# Patient Record
Sex: Male | Born: 1970 | Race: Black or African American | Hispanic: No | Marital: Married | Smoking: Current every day smoker
Health system: Southern US, Community
[De-identification: ages and names within clinical notes are randomized; demographics above are authoritative.]

---

## 2005-03-09 ENCOUNTER — Emergency Department: Payer: Self-pay | Admitting: Emergency Medicine

## 2005-03-09 ENCOUNTER — Other Ambulatory Visit: Payer: Self-pay

## 2006-01-04 IMAGING — CR DG CHEST 2V
1 series · 3 of 3 positions shown · non-contrast
Comparison: none

REASON FOR EXAM: Pain
COMMENTS:

[Series 1: view not recorded · 0.17mm/px · 3 of 3 slices shown]
[im 1/3]
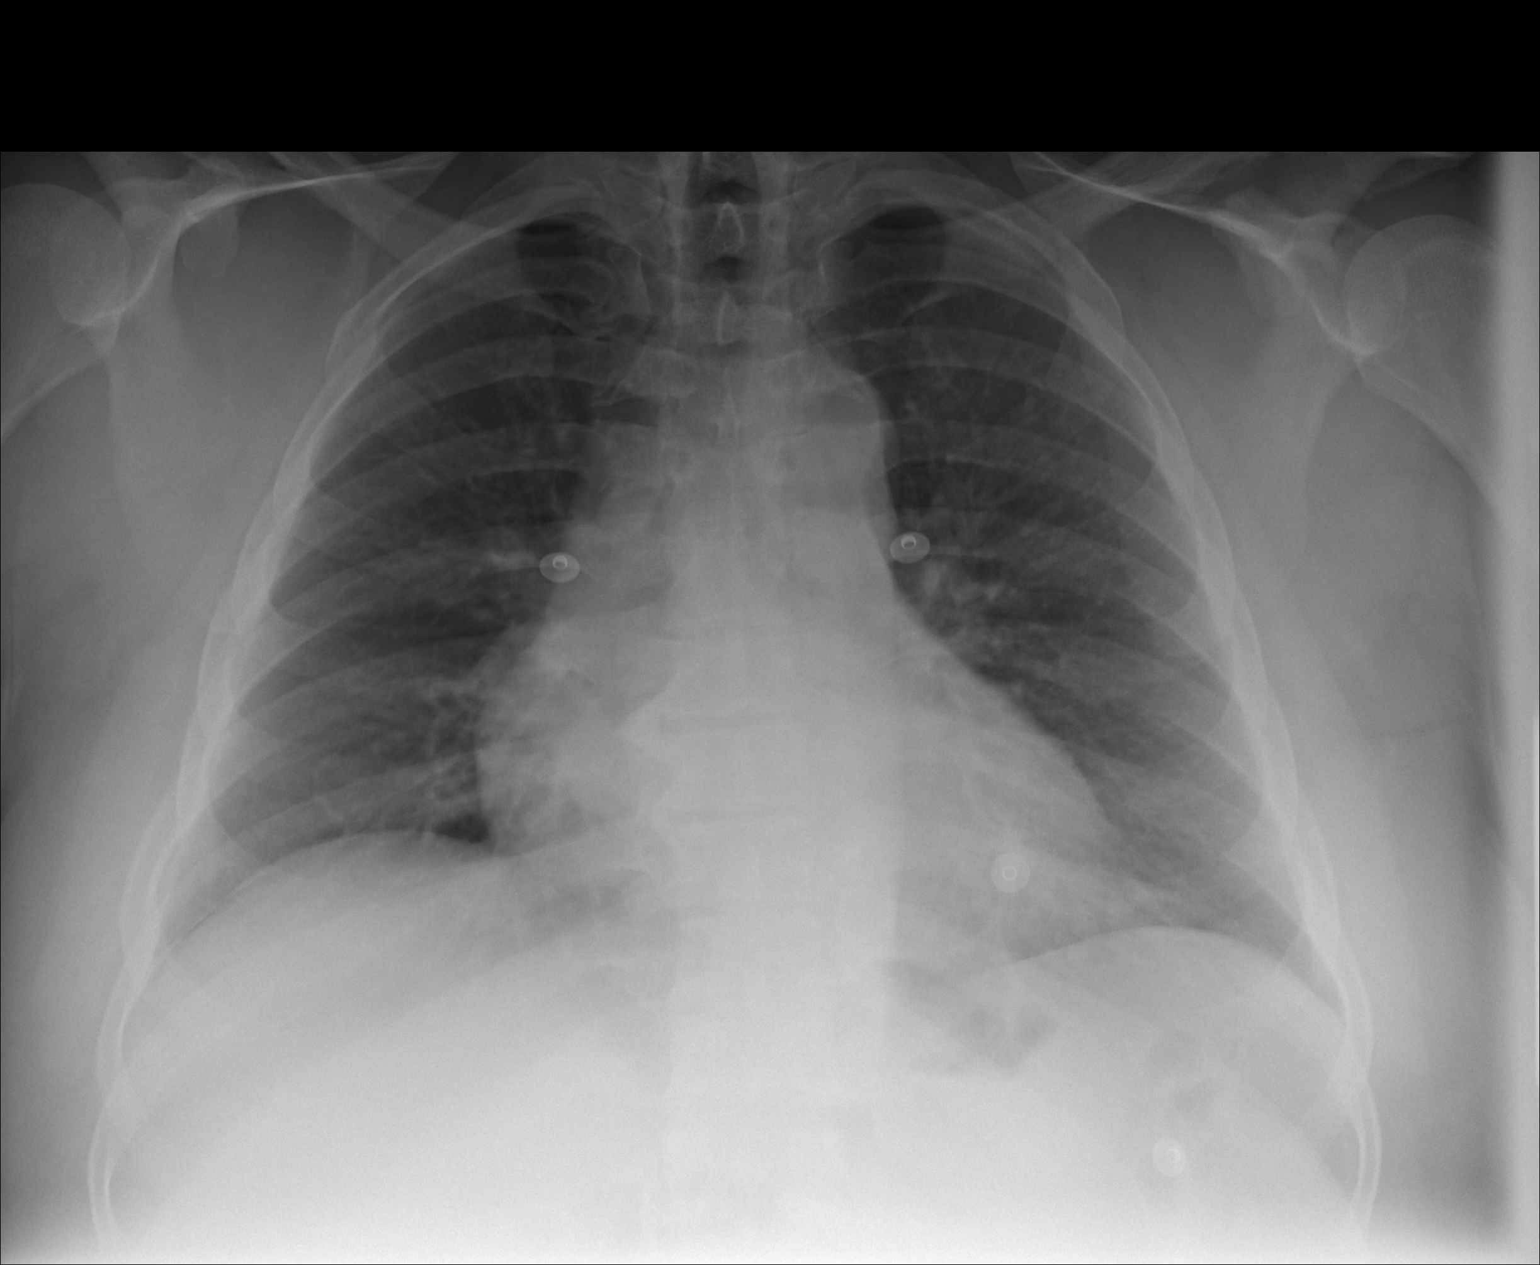
[im 2/3]
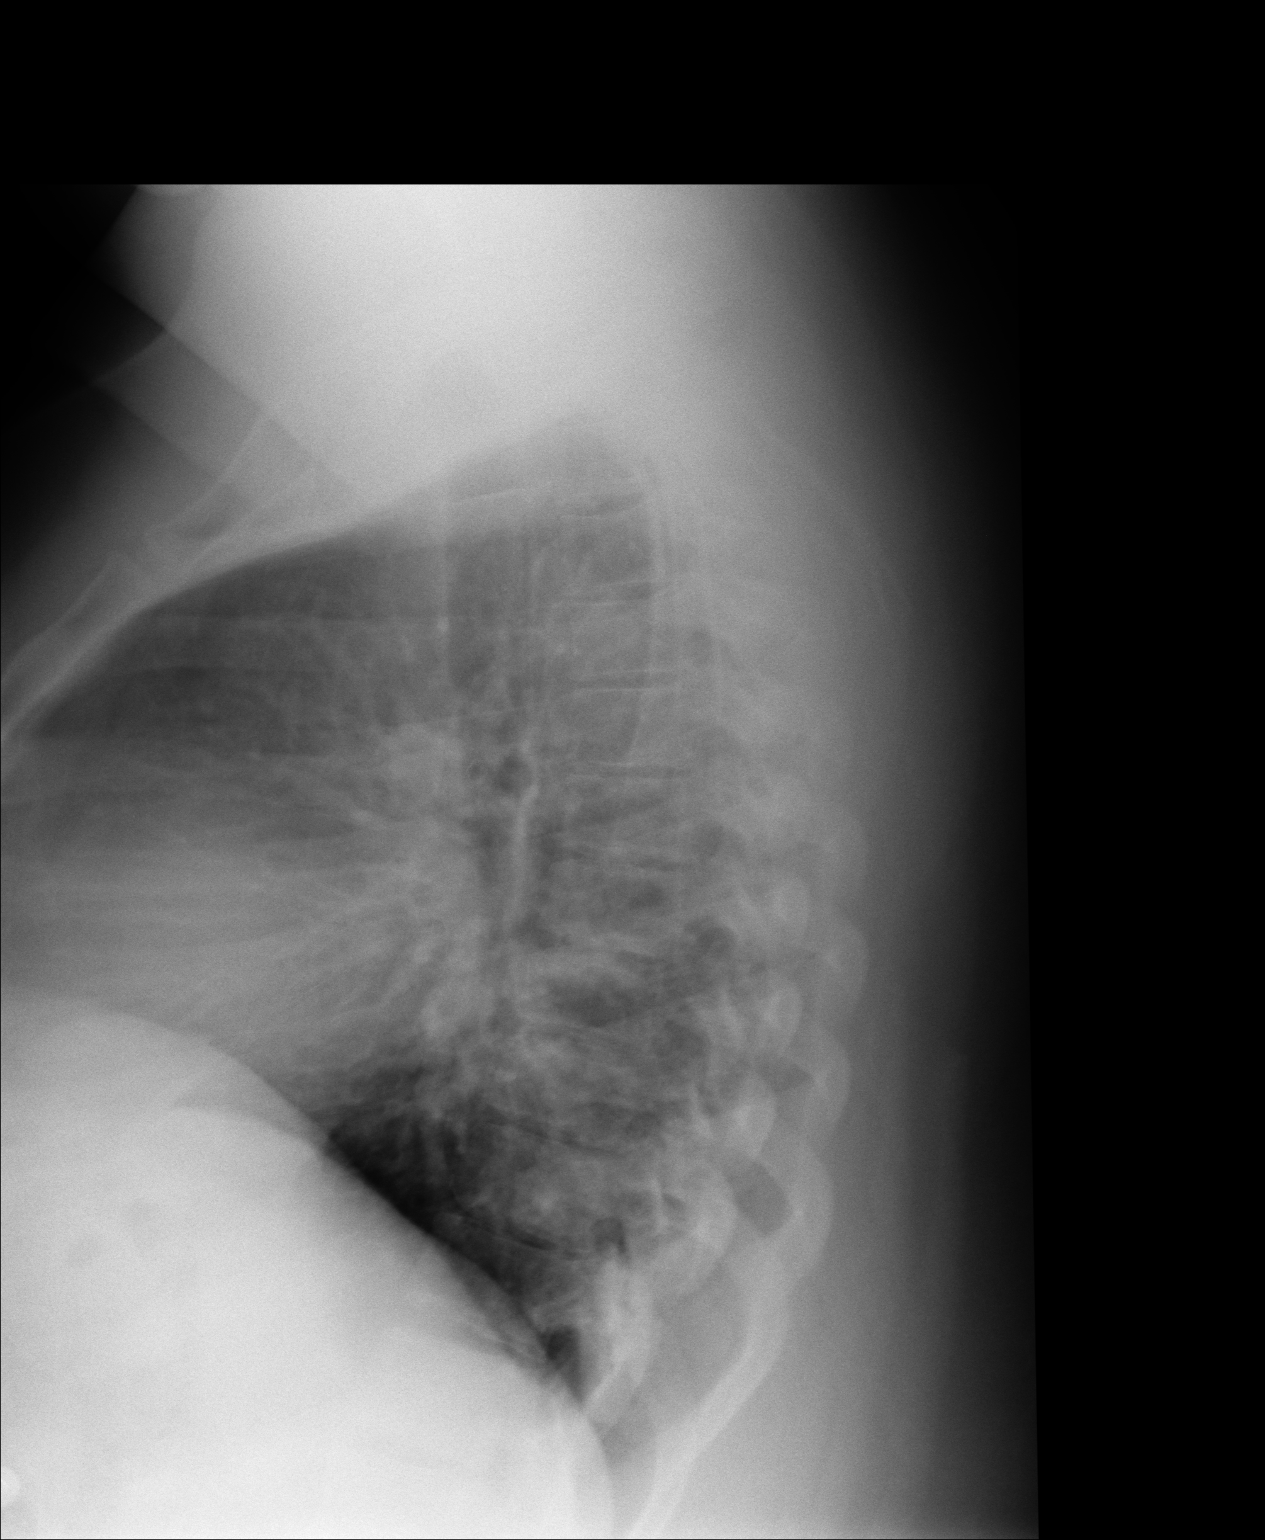
[im 3/3]
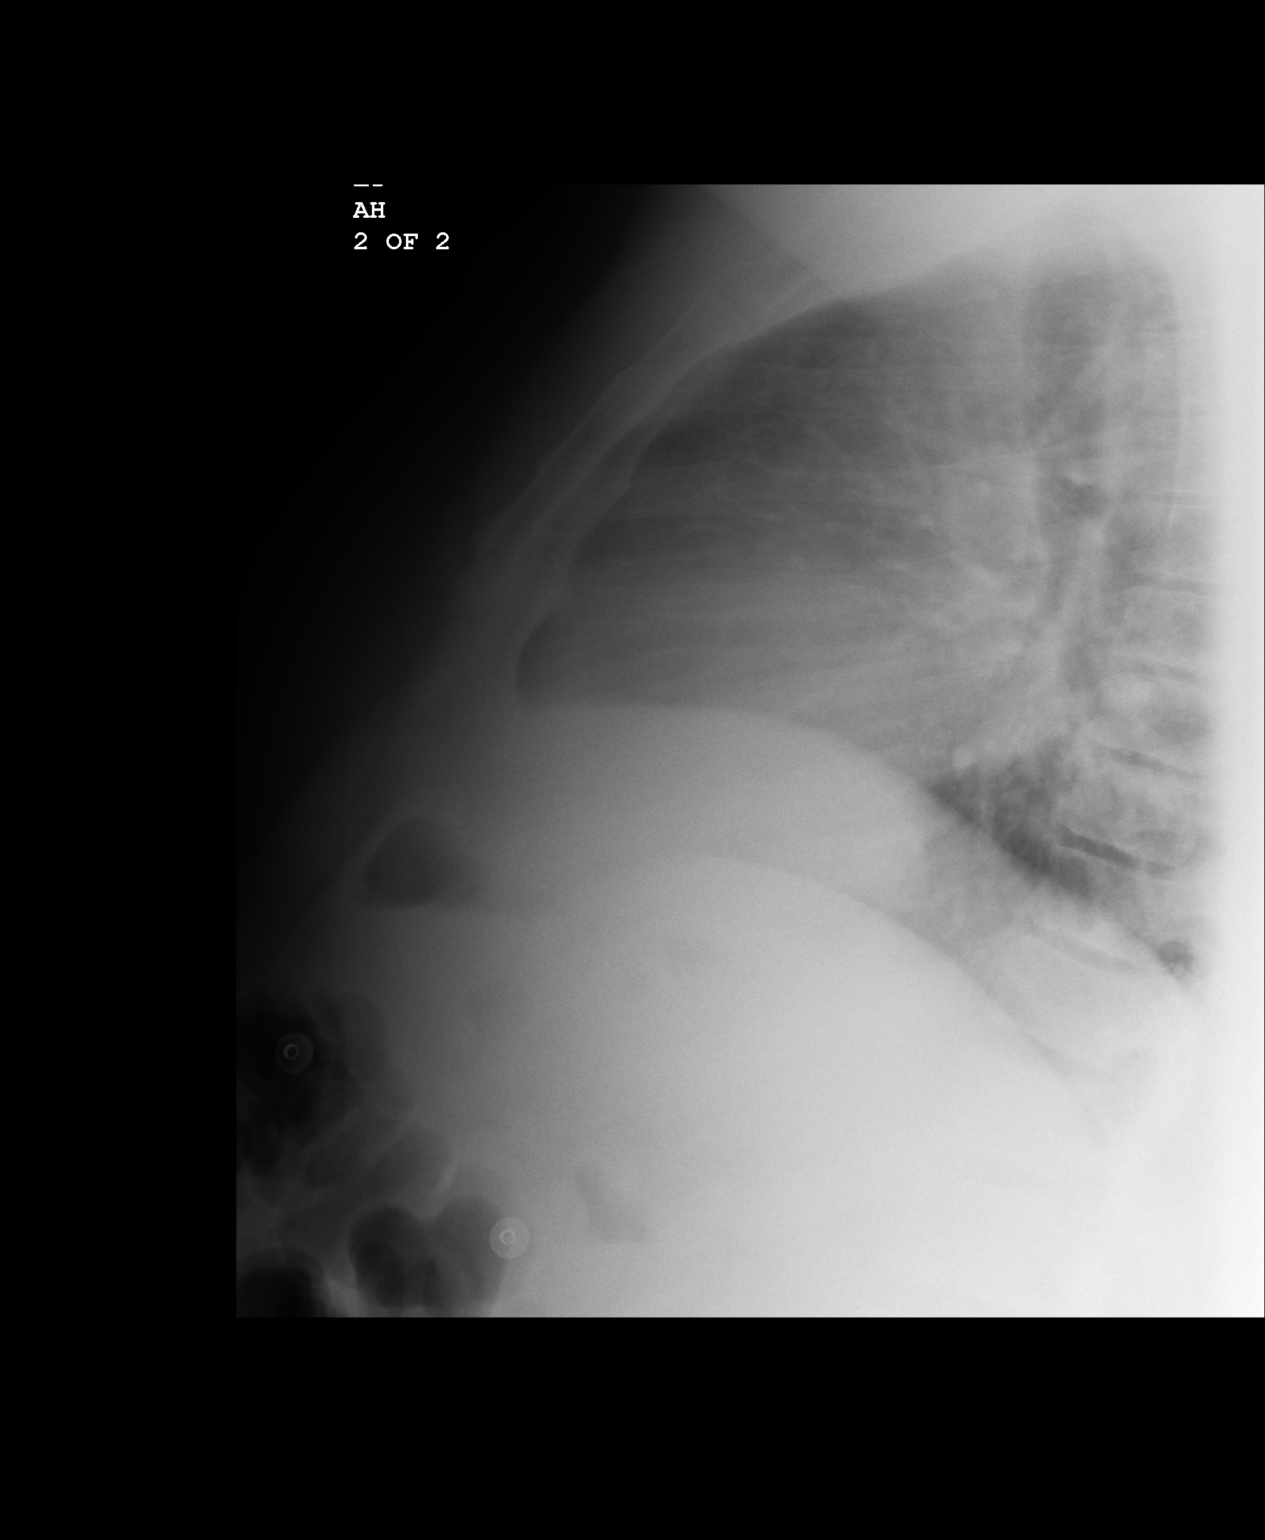

[3 of 3 positions shown; findings below may reference images not displayed]

PROCEDURE:     DXR - DXR CHEST PA (OR AP) AND LATERAL  - March 09, 2005  [DATE]

RESULT:     AP and lateral views were obtained.  No prior films are
available for comparison. The heart is top limits in size to mildly
enlarged.  The lung fields are clear.  The vascularity is within normal
limits.  No effusions are seen.
IMPRESSION: The heart is top normal in size to mildly enlarged.

Lung fields are clear.

## 2006-01-04 IMAGING — US ABDOMEN ULTRASOUND
1 series · 17 of 25 positions shown · non-contrast
Comparison: none

REASON FOR EXAM: RUQ pain
COMMENTS:

[Series 1: abdomen ultrasound · 17 of 50 slices shown]
[im 1/50]
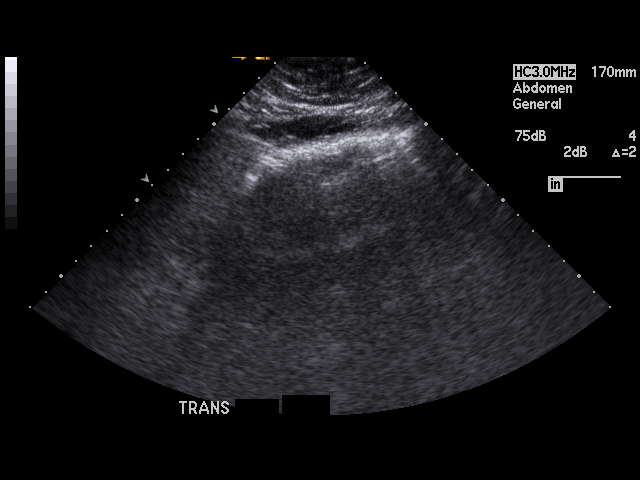
[im 5/50]
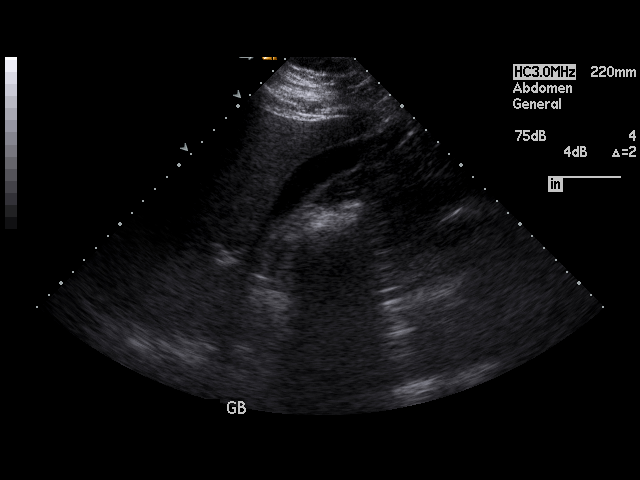
[im 7/50]
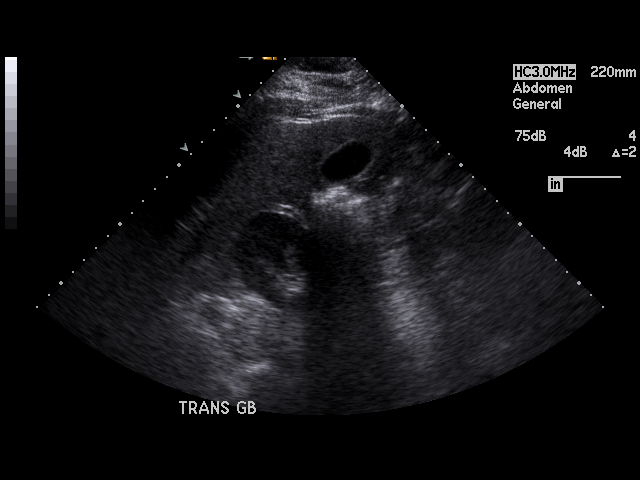
[im 11/50]
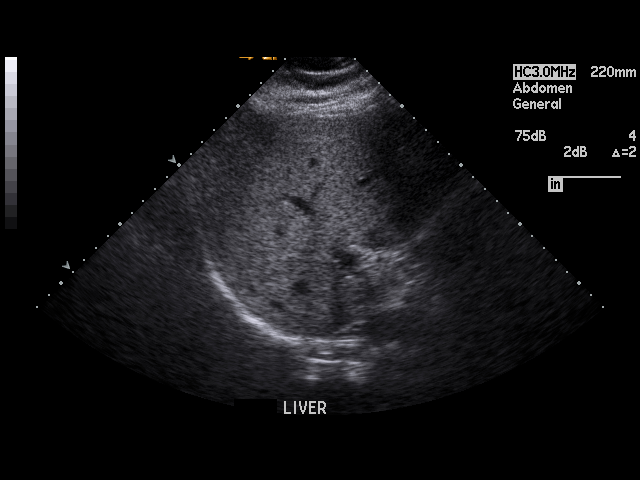
[im 13/50]
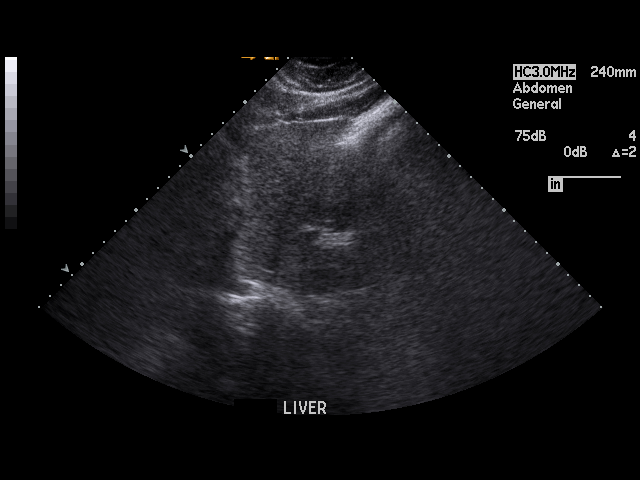
[im 17/50]
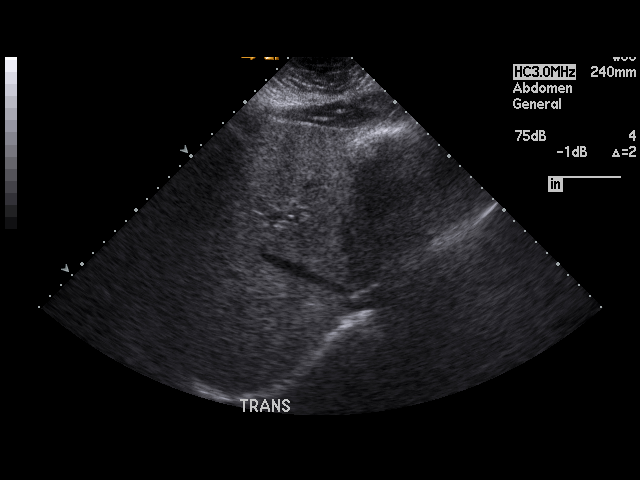
[im 19/50]
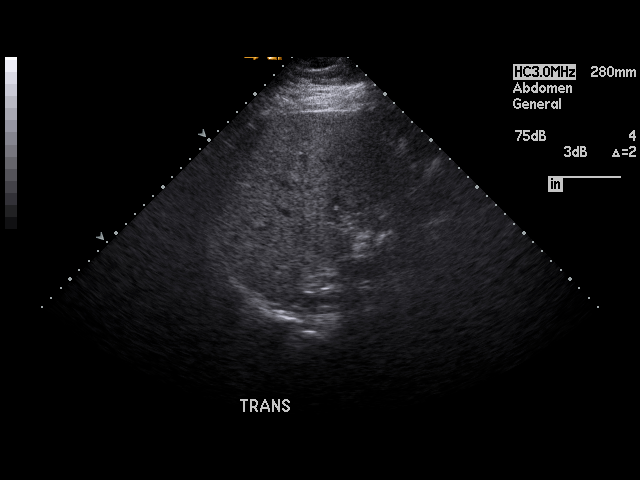
[im 23/50]
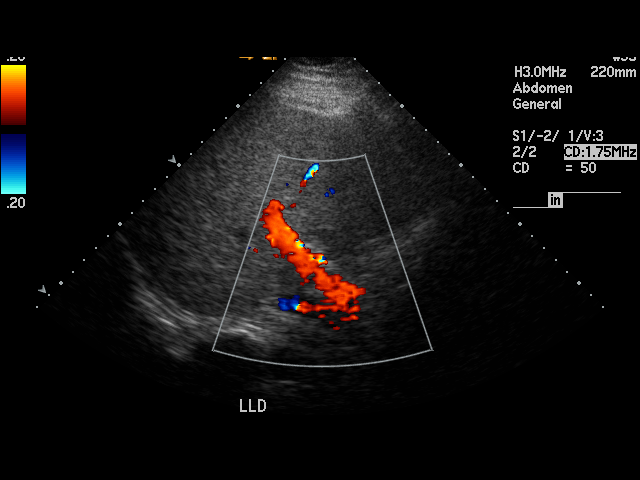
[im 25/50]
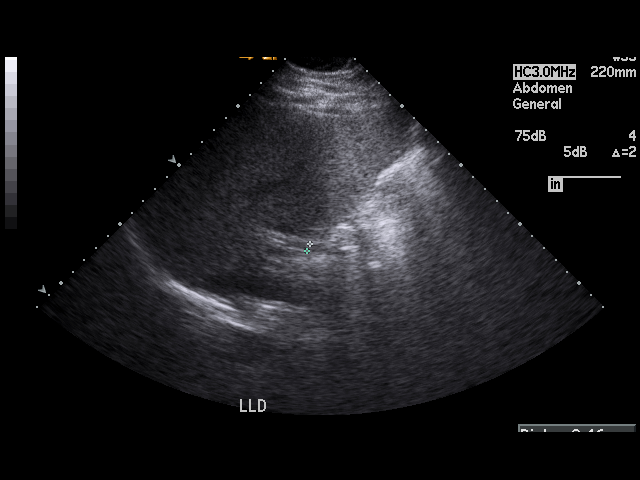
[im 27/50]
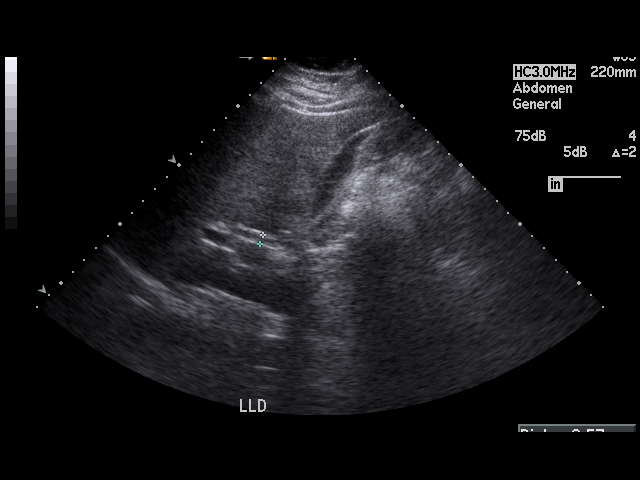
[im 31/50]
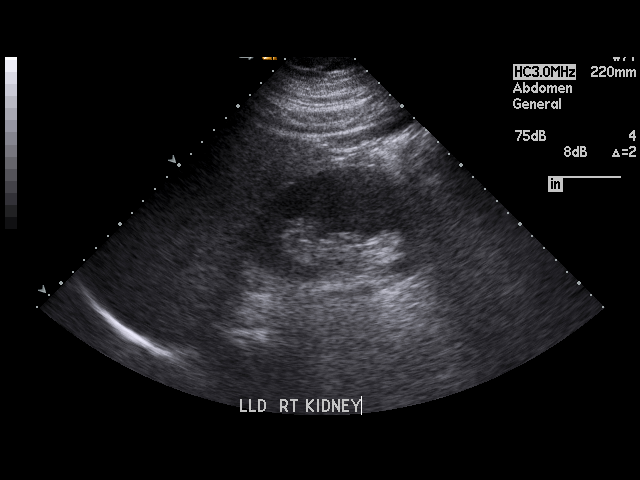
[im 33/50]
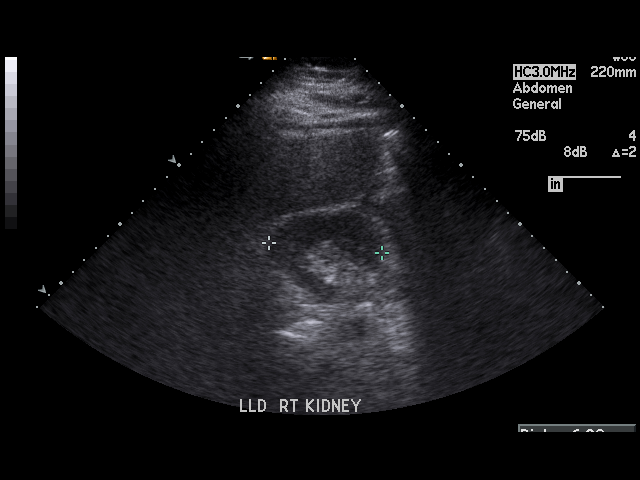
[im 37/50]
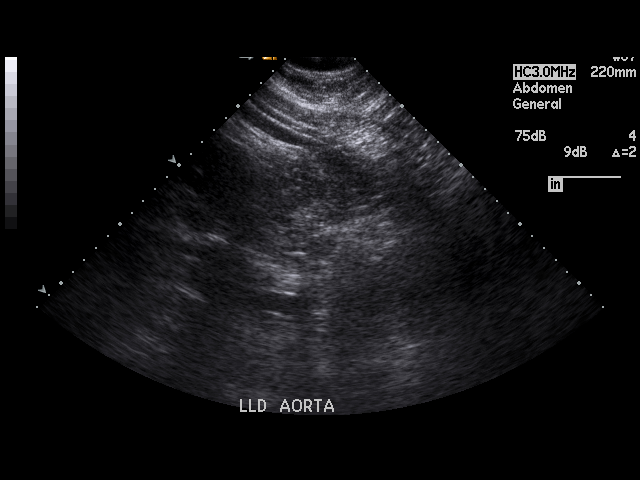
[im 39/50]
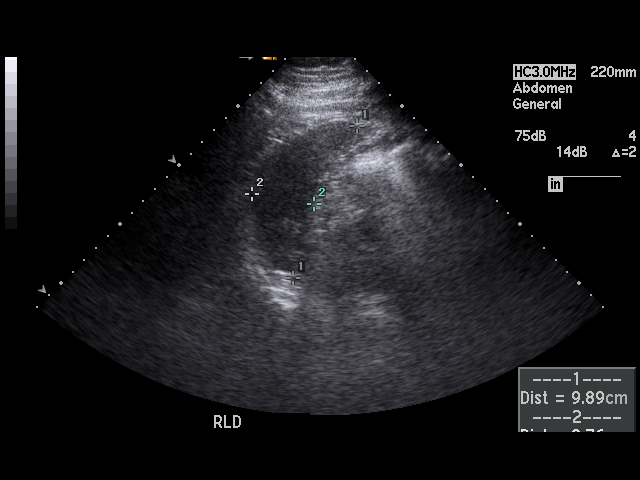
[im 43/50]
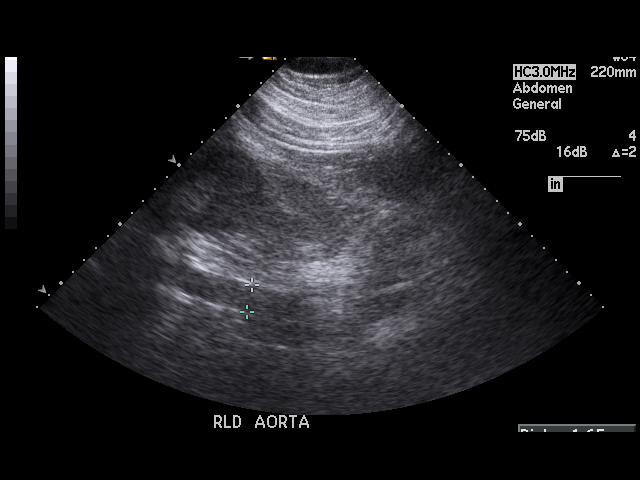
[im 45/50]
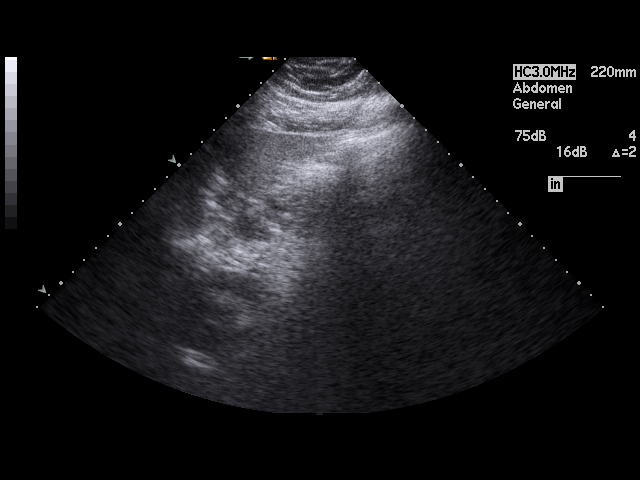
[im 50/50]
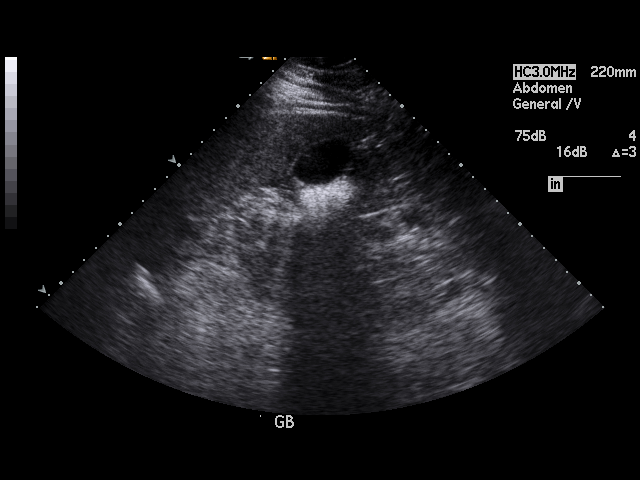

[17 of 25 positions shown; findings below may reference images not displayed]

PROCEDURE:     US  - US ABDOMEN GENERAL SURVEY  - March 09, 2005  [DATE]

RESULT:     The liver and spleen are normal in appearance. The pancreas is
not visualized adequate for evaluation on this exam. No gallstones are seen.
There is no thickening of the gallbladder wall. The common bile duct
measures 5.7 mm in diameter, which is within normal limits. The kidneys show
no hydronephrosis. There is no ascites.
IMPRESSION: 1)No significant abnormalities are noted.

2)The pancreas is not visualized adequate for evaluation on this exam.

## 2010-05-16 ENCOUNTER — Emergency Department: Payer: Self-pay | Admitting: Emergency Medicine

## 2011-10-04 ENCOUNTER — Emergency Department: Payer: Self-pay | Admitting: *Deleted

## 2011-10-04 LAB — CBC
MCHC: 33.6 g/dL (ref 32.0–36.0)
MCV: 90 fL (ref 80–100)
Platelet: 250 10*3/uL (ref 150–440)
RBC: 5.28 10*6/uL (ref 4.40–5.90)
RDW: 13.5 % (ref 11.5–14.5)
WBC: 10.6 10*3/uL (ref 3.8–10.6)

## 2011-10-04 LAB — COMPREHENSIVE METABOLIC PANEL
Albumin: 4 g/dL (ref 3.4–5.0)
Anion Gap: 12 (ref 7–16)
BUN: 17 mg/dL (ref 7–18)
Bilirubin,Total: 0.3 mg/dL (ref 0.2–1.0)
Chloride: 100 mmol/L (ref 98–107)
Co2: 23 mmol/L (ref 21–32)
Creatinine: 1.2 mg/dL (ref 0.60–1.30)
EGFR (African American): >60
Potassium: 3.6 mmol/L (ref 3.5–5.1)
SGPT (ALT): 38 U/L
Sodium: 135 mmol/L — ABNORMAL LOW (ref 136–145)
Total Protein: 8.7 g/dL — ABNORMAL HIGH (ref 6.4–8.2)

## 2011-10-04 LAB — CK TOTAL AND CKMB (NOT AT ARMC)
CK, Total: 1845 U/L — ABNORMAL HIGH (ref 35–232)
CK-MB: 0.6 ng/mL (ref 0.5–3.6)

## 2019-07-22 ENCOUNTER — Emergency Department: Payer: Self-pay

## 2019-07-22 ENCOUNTER — Other Ambulatory Visit: Payer: Self-pay

## 2019-07-22 ENCOUNTER — Emergency Department
Admission: EM | Admit: 2019-07-22 | Discharge: 2019-07-22 | Disposition: A | Discharge status: Home / Self Care | Payer: Self-pay | Attending: Emergency Medicine | Admitting: Emergency Medicine

## 2019-07-22 DIAGNOSIS — F17200 Nicotine dependence, unspecified, uncomplicated: Secondary | ICD-10-CM | POA: Insufficient documentation

## 2019-07-22 DIAGNOSIS — B349 Viral infection, unspecified: Secondary | ICD-10-CM

## 2019-07-22 DIAGNOSIS — R05 Cough: Secondary | ICD-10-CM | POA: Insufficient documentation

## 2019-07-22 DIAGNOSIS — Z20828 Contact with and (suspected) exposure to other viral communicable diseases: Secondary | ICD-10-CM | POA: Insufficient documentation

## 2019-07-22 MED ORDER — GUAIFENESIN-CODEINE 100-10 MG/5ML PO SOLN: 10.0000 mL | Freq: Three times a day (TID) | ORAL | 0 refills | Status: AC | PRN

## 2019-07-22 NOTE — ED Triage Notes (Signed)
Pt presents via POV c/o cough and congestion since Friday. Denies fevers.

## 2019-07-22 NOTE — ED Notes (Signed)
Pt states he has congestion in nose throat and chest. Pt denies fevers, SOB,  and sore throat. No white patches in throat.

## 2019-07-22 NOTE — Discharge Instructions (Addendum)
Please follow-up with your primary care provider of your choice for symptoms that are not improving over the next couple of weeks.  Return to the emergency department for symptoms that change or worsen if unable to schedule an appointment.

## 2019-07-22 NOTE — ED Provider Notes (Signed)
[facility] Emergency Department Provider Note  ____________________________________________  Time seen: Approximately 6:17 PM  I have reviewed the triage vital signs and the nursing notes.   HISTORY  Chief Complaint Nasal Congestion   HPI Starling Christofferson is a 48 y.o. male presents to the ER for evaluation of nasal congestion, cough with sputum production. Cough prevents sleep. He has taken OTC cold medications without much relief. Daughter recently diagnosed with pneumonia. No known fever. No known COVID exposure   History reviewed. No pertinent past medical history.  There are no active problems to display for this patient.   History reviewed. No pertinent surgical history.  Prior to Admission medications   Medication Sig Start Date End Date Taking? Authorizing Provider  guaiFENesin-codeine 100-10 MG/5ML syrup Take 10 mLs by mouth 3 (three) times daily as needed. 07/22/19   Victorino Dike, FNP    Allergies Patient has no known allergies.  History reviewed. No pertinent family history.  Social History Social History   Tobacco Use  . Smoking status: Current Every Day Smoker  . Smokeless tobacco: Never Used  Substance Use Topics  . Alcohol use: Not on file  . Drug use: Not on file    Review of Systems Constitutional: No fever/chills. Normal appetite. ENT: No sore throat. Cardiovascular: Denies chest pain. Respiratory: Negative for shortness of breath. Positive for cough. No wheezing.  Gastrointestinal: Negative for nausea,  no vomiting.  no diarrhea.  Musculoskeletal: Negative for body aches Skin: Negative for rash. Neurological: Positive for headaches ____________________________________________   PHYSICAL EXAM:  VITAL SIGNS: ED Triage Vitals [07/22/19 1722]  Enc Vitals Group     BP (!) 171/88     Pulse Rate (!) 57     Resp 14     Temp 98.4 F (36.9 C)     Temp src      SpO2 96 %     Weight      Height      Head  Circumference      Peak Flow      Pain Score 0     Pain Loc      Pain Edu?      Excl. in [place]?     Constitutional: Alert and oriented. Well appearing and in no acute distress. Eyes: Conjunctivae are normal. Ears: Bilateral TM normal. Nose: Maxillary sinus congestion noted; no rhinnorhea. Mouth/Throat: Mucous membranes are moist.  Oropharynx mildly erythema. Tonsils mildly erythematous. Uvula midline. Neck: No stridor.  Lymphatic: No cervical lymphadenopathy. Cardiovascular: Normal rate, regular rhythm. Good peripheral circulation. Respiratory: Respirations are even and unlabored.  No retractions. Breath sounds clear to auscultation. Gastrointestinal: Soft and nontender.  Musculoskeletal: FROM x 4 extremities.  Neurologic:  Normal speech and language. Skin:  Skin is warm, dry and intact. No rash noted. Psychiatric: Mood and affect are normal. Speech and behavior are normal.  ____________________________________________   LABS (all labs ordered are listed, but only abnormal results are displayed)  Labs Reviewed  SARS CORONAVIRUS 2 (TAT 6-24 HRS)   ____________________________________________  EKG  Not indicated. ____________________________________________  RADIOLOGY  Chest x-ray is negative for acute findings per radiology. ____________________________________________   PROCEDURES  Procedure(s) performed: None  Critical Care performed: No ____________________________________________   INITIAL IMPRESSION / ASSESSMENT AND PLAN / ED COURSE  48 y.o. male presenting to the emergency department for treatment and evaluation of cough.  See HPI for further details.  Chest x-ray is reassuring.  Remainder of exam is also reassuring.  COVID 19 screening  to be completed prior to discharge.  Patient is aware that he will need to stay home from work until his screening is back.  If positive, he will need to remain quarantined for 2 weeks.  If negative he may return to work  when symptoms have resolved.  Adain Geurin was evaluated in Emergency Department on 07/22/2019 for the symptoms described in the history of present illness. He was evaluated in the context of the global COVID-19 pandemic, which necessitated consideration that the patient might be at risk for infection with the SARS-CoV-2 virus that causes COVID-19. Institutional protocols and algorithms that pertain to the evaluation of patients at risk for COVID-19 are in a state of rapid change based on information released by regulatory bodies including the CDC and federal and state organizations. These policies and algorithms were followed during the patient's care in the ED.     Medications - No data to display  ED Discharge Orders         Ordered    guaiFENesin-codeine 100-10 MG/5ML syrup  3 times daily PRN     07/22/19 1932           Pertinent labs & imaging results that were available during my care of the patient were reviewed by me and considered in my medical decision making (see chart for details).    If controlled substance prescribed during this visit, 12 month history viewed on the [facility] prior to issuing an initial prescription for Schedule II or III opiod. ____________________________________________   FINAL CLINICAL IMPRESSION(S) / ED DIAGNOSES  Final diagnoses:  Acute viral syndrome    Note:  This document was prepared using Dragon voice recognition software and may include unintentional dictation errors.    Chinita Pester, FNP 07/22/19 Aretha Parrot    Jene Every, MD 07/22/19 1958

## 2019-07-23 ENCOUNTER — Telehealth: Payer: Self-pay | Admitting: General Practice

## 2019-07-23 LAB — SARS CORONAVIRUS 2 (TAT 6-24 HRS): SARS Coronavirus 2: NEGATIVE

## 2019-07-23 NOTE — Telephone Encounter (Signed)
Patient called in and received her covid test result  °

## 2020-04-02 ENCOUNTER — Telehealth: Payer: Self-pay | Admitting: General Practice

## 2020-04-02 NOTE — Telephone Encounter (Signed)
Individual has been contacted 3+ times regarding ED referral and has been given information regarding the clinic. No further attempts to contact individual will be made. 

## 2020-05-18 IMAGING — DX DG CHEST 1V PORT
2 series · 2 of 2 positions shown · non-contrast
Comparison: Chest radiograph dated 10/04/2011

CLINICAL DATA: 48-year-old male with cough.

EXAM:
PORTABLE CHEST 1 VIEW

[chest ap (1 of 2)]
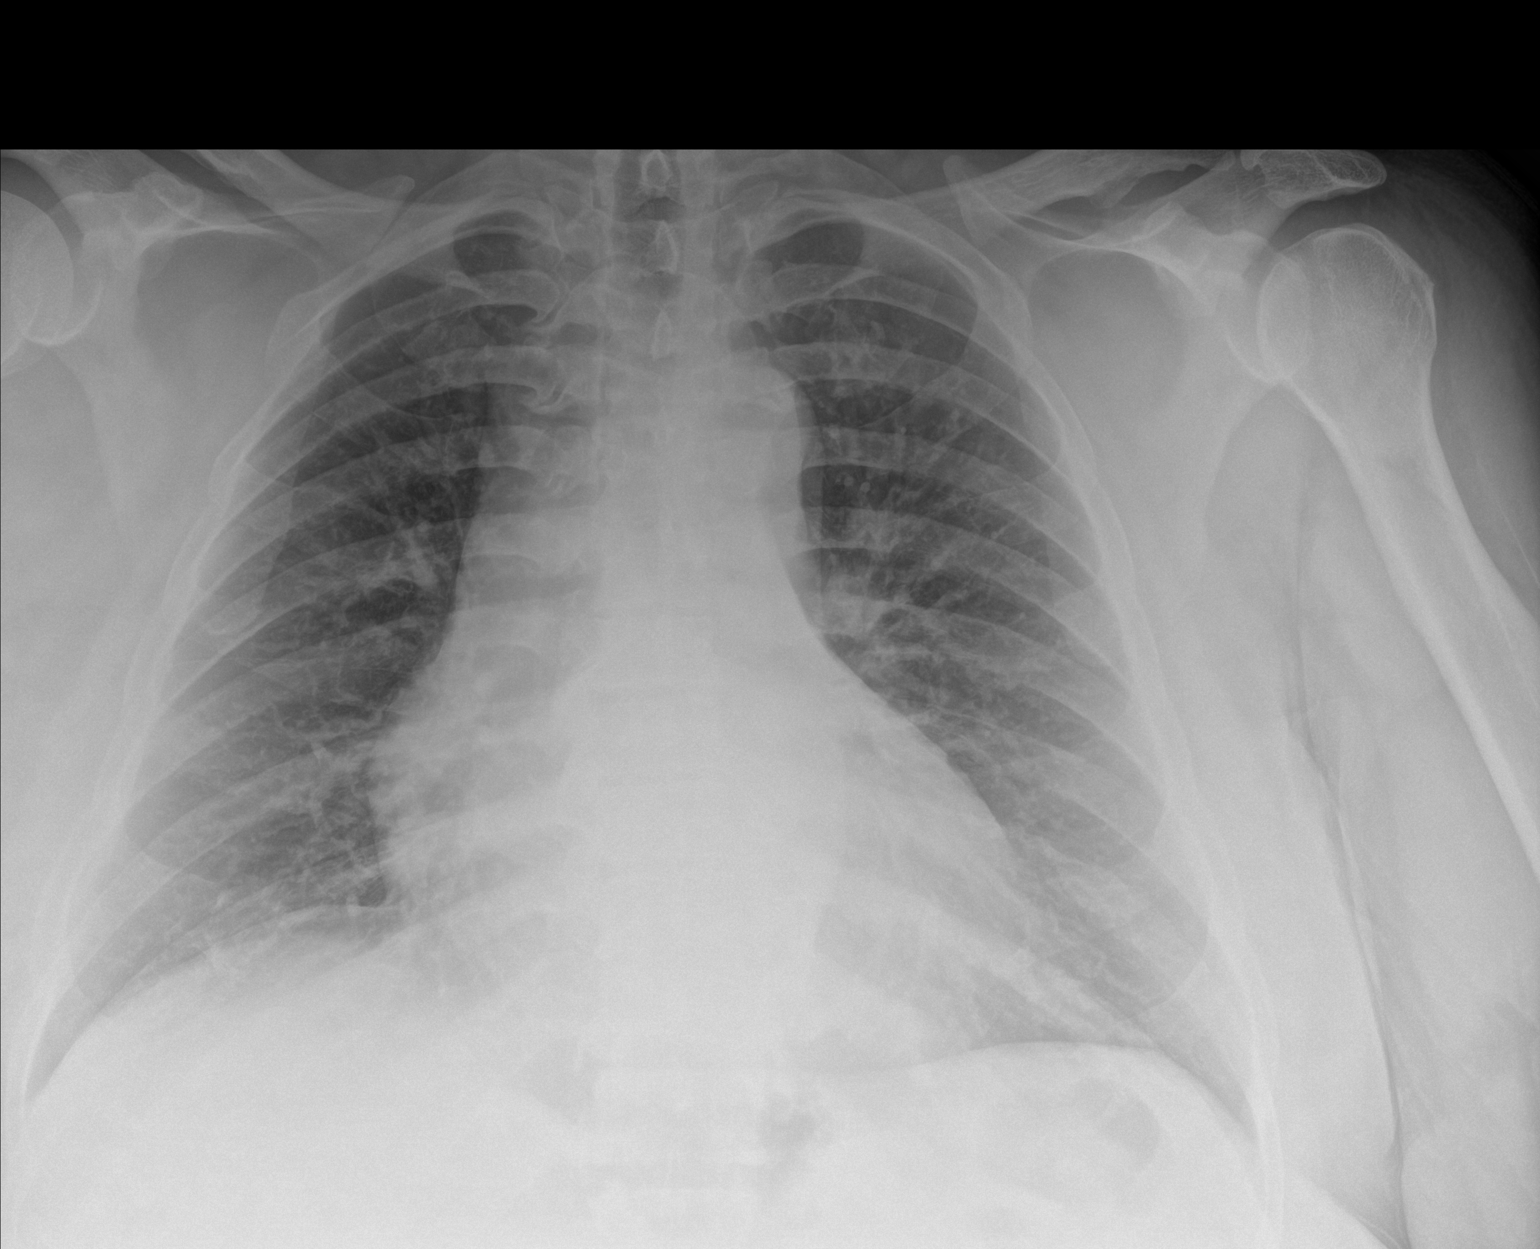

[chest ap (2 of 2)]
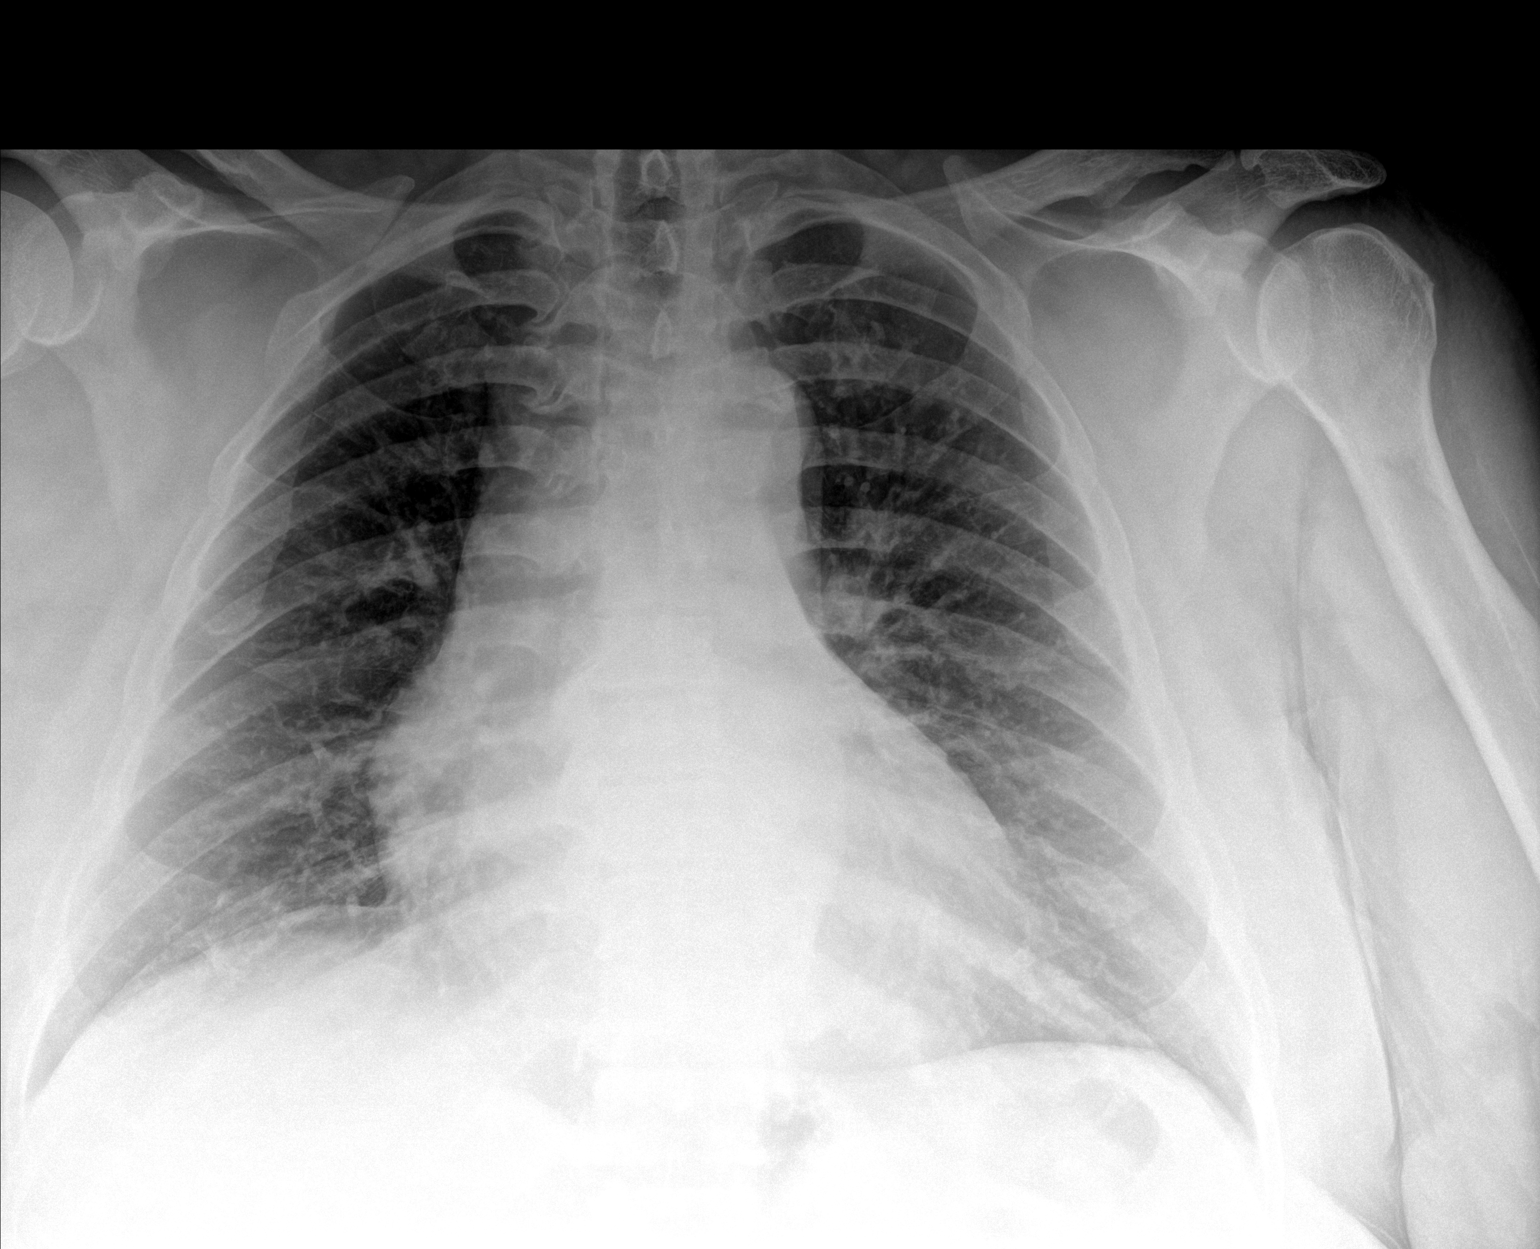

[2 of 2 positions shown; findings below may reference images not displayed]

FINDINGS: There is no focal consolidation, pleural effusion, pneumothorax.
Mild cardiomegaly. No vascular congestion or edema. No acute osseous
pathology.
IMPRESSION: 1. No acute cardiopulmonary process.
2. Mild cardiomegaly.

## 2023-08-25 DIAGNOSIS — S82152A Displaced fracture of left tibial tuberosity, initial encounter for closed fracture: Secondary | ICD-10-CM | POA: Diagnosis not present

## 2023-08-25 DIAGNOSIS — S3992XA Unspecified injury of lower back, initial encounter: Secondary | ICD-10-CM | POA: Diagnosis not present

## 2023-08-25 DIAGNOSIS — F17218 Nicotine dependence, cigarettes, with other nicotine-induced disorders: Secondary | ICD-10-CM | POA: Diagnosis not present

## 2023-08-25 DIAGNOSIS — Z6841 Body Mass Index (BMI) 40.0 and over, adult: Secondary | ICD-10-CM | POA: Diagnosis not present

## 2023-08-25 DIAGNOSIS — S82102A Unspecified fracture of upper end of left tibia, initial encounter for closed fracture: Secondary | ICD-10-CM | POA: Diagnosis not present

## 2023-08-25 DIAGNOSIS — E66813 Obesity, class 3: Secondary | ICD-10-CM | POA: Diagnosis not present

## 2023-08-25 DIAGNOSIS — F17210 Nicotine dependence, cigarettes, uncomplicated: Secondary | ICD-10-CM | POA: Diagnosis not present

## 2023-08-25 DIAGNOSIS — S82102D Unspecified fracture of upper end of left tibia, subsequent encounter for closed fracture with routine healing: Secondary | ICD-10-CM | POA: Diagnosis not present

## 2023-08-25 DIAGNOSIS — M25552 Pain in left hip: Secondary | ICD-10-CM | POA: Diagnosis not present

## 2023-08-25 DIAGNOSIS — R4020 Unspecified coma: Secondary | ICD-10-CM | POA: Diagnosis not present

## 2023-08-25 DIAGNOSIS — I10 Essential (primary) hypertension: Secondary | ICD-10-CM | POA: Diagnosis not present

## 2023-08-25 DIAGNOSIS — Z7982 Long term (current) use of aspirin: Secondary | ICD-10-CM | POA: Diagnosis not present

## 2023-08-25 DIAGNOSIS — S82142A Displaced bicondylar fracture of left tibia, initial encounter for closed fracture: Secondary | ICD-10-CM | POA: Diagnosis not present

## 2023-08-25 DIAGNOSIS — S82112A Displaced fracture of left tibial spine, initial encounter for closed fracture: Secondary | ICD-10-CM | POA: Diagnosis not present

## 2023-08-25 DIAGNOSIS — Z9889 Other specified postprocedural states: Secondary | ICD-10-CM | POA: Diagnosis not present

## 2023-08-25 DIAGNOSIS — S299XXA Unspecified injury of thorax, initial encounter: Secondary | ICD-10-CM | POA: Diagnosis not present

## 2023-08-25 DIAGNOSIS — S199XXA Unspecified injury of neck, initial encounter: Secondary | ICD-10-CM | POA: Diagnosis not present

## 2023-08-25 DIAGNOSIS — Z041 Encounter for examination and observation following transport accident: Secondary | ICD-10-CM | POA: Diagnosis not present

## 2023-08-25 DIAGNOSIS — M25562 Pain in left knee: Secondary | ICD-10-CM | POA: Diagnosis not present

## 2023-09-14 DIAGNOSIS — S82102D Unspecified fracture of upper end of left tibia, subsequent encounter for closed fracture with routine healing: Secondary | ICD-10-CM | POA: Diagnosis not present

## 2024-03-20 NOTE — Congregational Nurse Program (Signed)
  Dept: [phone]   Congregational Nurse Program Note  Date of Encounter: 03/20/2024  Past Medical History: No past medical history on file.  Encounter Details:  Community Questionnaire - 03/20/24 1014       Questionnaire   Ask client: Do you give verbal consent for me to treat you today? Yes    Student Assistance N/A    Location [facility] S[facility]  [facility]    Encounter Setting CN site    Population Status Unknown    Insurance Uninsured ([employer] Connects/Self-Pay/Medicaid Family Planning)    Insurance/Financial Assistance Referral N/A    Medication Referred to Medication Assistance    Medical Provider Yes    Screening Referrals Made N/A    Medical Referrals Made N/A    Medical Appointment Completed N/A    CNP Interventions Advocate/Support;Educate    Screenings CN Performed Blood Pressure    ED Visit Averted N/A    Life-Saving Intervention Made N/A          Today's Vitals   03/20/24 1014  BP: (!) 138/90   There is no height or weight on file to calculate BMI.  Patient has fractured leg in November and says that he has a great deal of pain, but not able to afford medications. Nurse provided resources for medication assistance program through [facility].  Nurse also gave resource for [facility].

## 2024-05-15 LAB — GLUCOSE, POCT (MANUAL RESULT ENTRY): POC Glucose: 76 mg/dL (ref 70–99)

## 2024-05-15 NOTE — Congregational Nurse Program (Signed)
  Dept: [phone]   Congregational Nurse Program Note  Date of Encounter: 05/15/2024  Past Medical History: No past medical history on file.  Encounter Details:  Community Questionnaire - 05/15/24 1338       Questionnaire   Ask client: Do you give verbal consent for me to treat you today? Yes    Student Assistance N/A    [facility]  [facility]    Encounter Setting CN site    Population Status Unknown    Insurance Uninsured ([employer] Connects/Self-Pay/Medicaid Family Planning)    Insurance/Financial Assistance Referral N/A    Medication N/A    Medical Provider Yes    Screening Referrals Made N/A    Medical Referrals Made N/A    Medical Appointment Completed N/A    CNP Interventions Advocate/Support    Screenings CN Performed Blood Pressure;Blood Glucose    ED Visit Averted N/A    Life-Saving Intervention Made N/A          Today's Vitals   05/15/24 1337  BP: (!) 138/90   There is no height or weight on file to calculate BMI.

## 2024-05-29 LAB — GLUCOSE, POCT (MANUAL RESULT ENTRY): POC Glucose: 105 mg/dL — AB (ref 70–99)

## 2024-05-29 NOTE — Congregational Nurse Program (Signed)
  Dept: [phone]   Congregational Nurse Program Note  Date of Encounter: 05/29/2024  Patient encouraged to decrease salt to help with elevated BP.  Patient encouraged to seek out PCP at [facility] for history of elevated BP's.  Past Medical History: No past medical history on file.  Encounter Details:  Community Questionnaire - 05/29/24 0930       Questionnaire   Ask client: Do you give verbal consent for me to treat you today? Yes    Student Assistance N/A    [facility] S[facility]  [facility]    Encounter Setting CN site    Population Status Unknown    Insurance Uninsured ([employer] Connects/Self-Pay/Medicaid Family Planning)    Insurance/Financial Assistance Referral N/A    Medication N/A    Medical Provider Yes    Screening Referrals Made N/A    Medical Referrals Made N/A    Medical Appointment Completed N/A    CNP Interventions Advocate/Support    Screenings CN Performed Blood Pressure;Blood Glucose    ED Visit Averted N/A    Life-Saving Intervention Made N/A

## 2024-07-10 LAB — GLUCOSE, POCT (MANUAL RESULT ENTRY): POC Glucose: 83 mg/dL (ref 70–99)

## 2024-07-10 NOTE — Congregational Nurse Program (Signed)
  Dept: [phone]   Congregational Nurse Program Note  Date of Encounter: 07/10/2024  Patient complains of pain in leg that has been going on for years.   Elevated BP, patient given handout for decreasing BP.  Past Medical History: No past medical history on file.  Encounter Details:  Community Questionnaire - 07/10/24 0956       Questionnaire   Student Assistance N/A    [facility] Served  [facility]    Encounter Setting CN site    Population Status Unknown    Insurance Uninsured ([employer]/Care Connects/Self-Pay/Medicaid Family Planning)    Insurance/Financial Assistance Referral N/A    Medication N/A    Medical Provider Yes    Screening Referrals Made N/A    Medical Referrals Made N/A    Medical Appointment Completed N/A    CNP Interventions Advocate/Support    Screenings CN Performed Blood Pressure;Blood Glucose    ED Visit Averted N/A    Life-Saving Intervention Made N/A

## 2024-08-22 LAB — GLUCOSE, POCT (MANUAL RESULT ENTRY): POC Glucose: 97 mg/dL (ref 70–99)

## 2024-08-22 NOTE — Congregational Nurse Program (Signed)
  Dept: [phone]   Congregational Nurse Program Note  Date of Encounter: 08/22/2024  Past Medical History: No past medical history on file.  Encounter Details:  Community Questionnaire - 08/22/24 1436       Questionnaire   Ask client: Do you give verbal consent for me to treat you today? Yes    Student Assistance N/A    Location Patient Information Systems Manager, [employer]    Encounter Setting CN site    Population Status Unknown    Insurance Uninsured ([employer]/Care Connects/Self-Pay/Medicaid Family Planning)    Insurance/Financial Assistance Referral N/A    Medication N/A    Medical Provider No    Screening Referrals Made N/A    Medical Referrals Made N/A    Medical Appointment Completed N/A    CNP Interventions Advocate/Support    Screenings CN Performed Blood Pressure;Blood Glucose    ED Visit Averted N/A    Life-Saving Intervention Made N/A
